# Patient Record
Sex: Female | Born: 1970 | Race: White | Hispanic: No | Marital: Married | Smoking: Never smoker
Health system: Southern US, Community
[De-identification: ages and names within clinical notes are randomized; demographics above are authoritative.]

## PROBLEM LIST (undated history)

## (undated) DIAGNOSIS — T7840XA Allergy, unspecified, initial encounter: Secondary | ICD-10-CM

## (undated) HISTORY — PX: HERNIA REPAIR: SHX51

## (undated) HISTORY — PX: GASTRIC BYPASS: SHX52

## (undated) HISTORY — DX: Allergy, unspecified, initial encounter: T78.40XA

---

## 2000-03-26 ENCOUNTER — Other Ambulatory Visit: Admission: RE | Admit: 2000-03-26 | Discharge: 2000-03-26 | Payer: Self-pay | Admitting: Gynecology

## 2000-03-26 ENCOUNTER — Encounter (INDEPENDENT_AMBULATORY_CARE_PROVIDER_SITE_OTHER): Payer: Self-pay | Admitting: Specialist

## 2007-07-18 ENCOUNTER — Ambulatory Visit (HOSPITAL_COMMUNITY): Admission: RE | Admit: 2007-07-18 | Discharge: 2007-07-18 | Payer: Self-pay | Admitting: Obstetrics & Gynecology

## 2007-07-18 IMAGING — US US MFM FETAL BPP W/ NONSTRESS ADDL GEST
1 series · 11 of 11 positions shown · non-contrast
Comparison: none

OBSTETRICAL ULTRASOUND:
 This ultrasound was performed in The [HOSPITAL], and the [facility] report will be stored to [REDACTED] PACS.

[Series 1: us mfm fetal bpp w/ nonstress addl gest · 11 acquisitions, 11 frames shown]
[im 1/11]
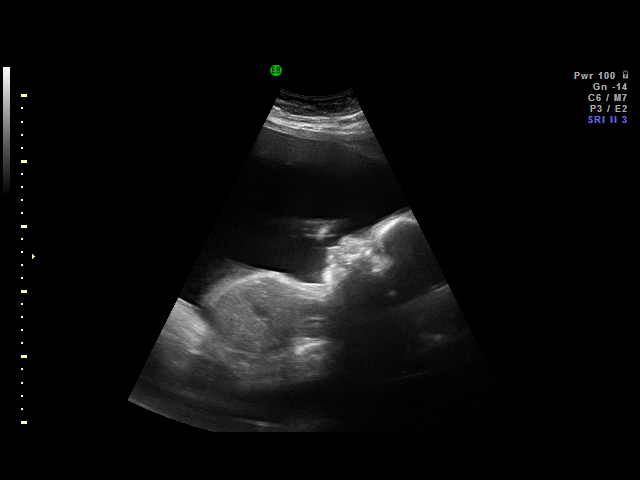
[im 2/11]
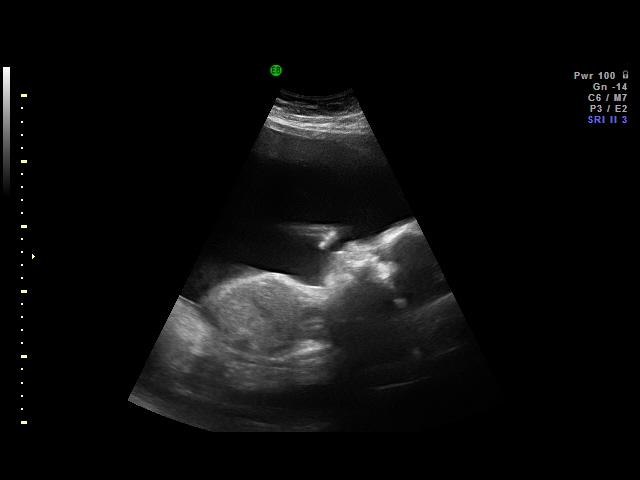
[im 3/11]
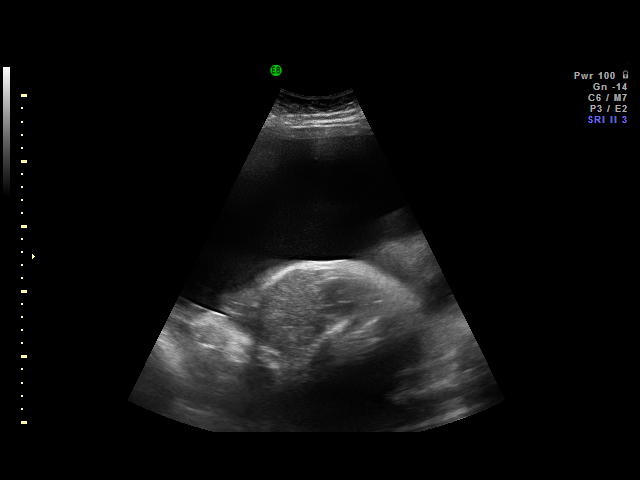
[im 4/11]
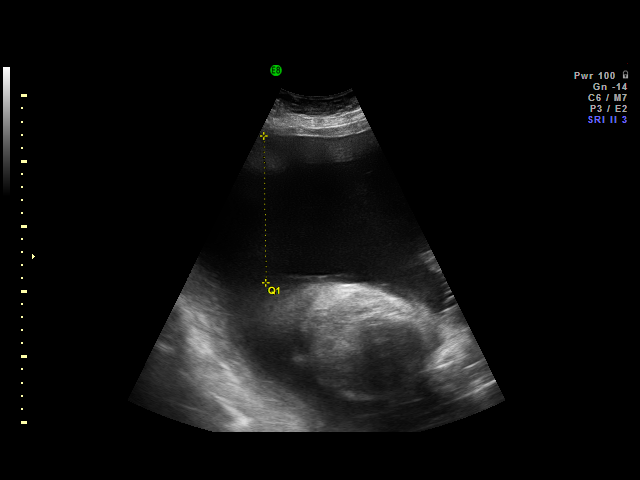
[im 5/11]
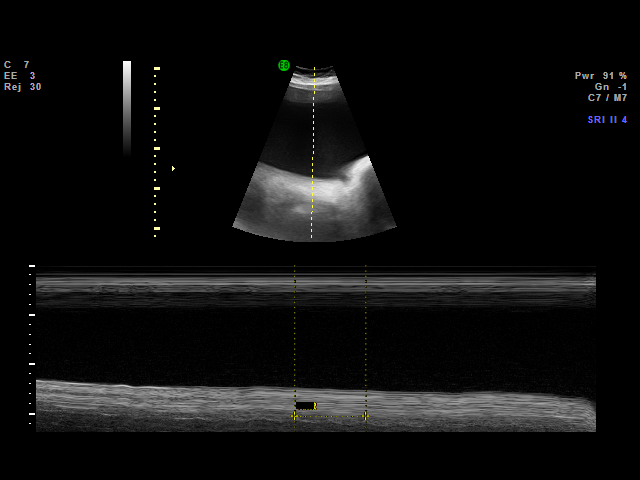
[im 6/11]
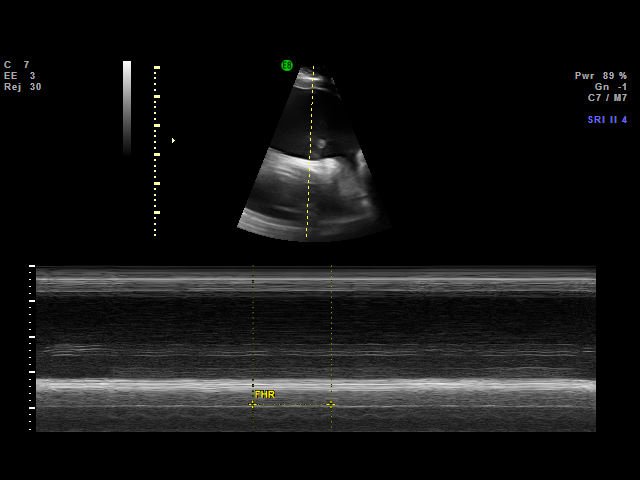
[im 7/11]
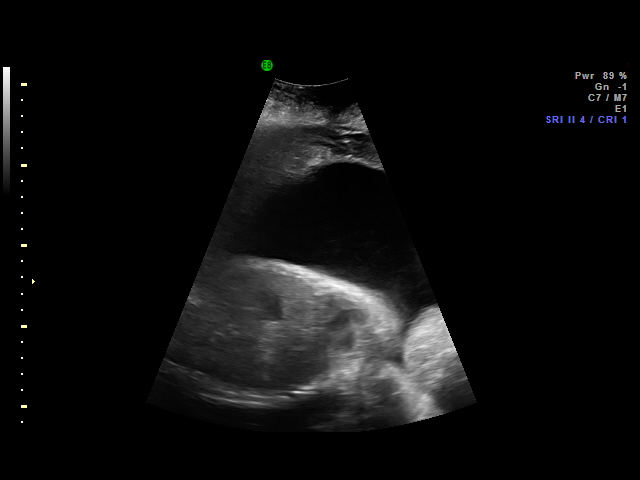
[im 8/11]
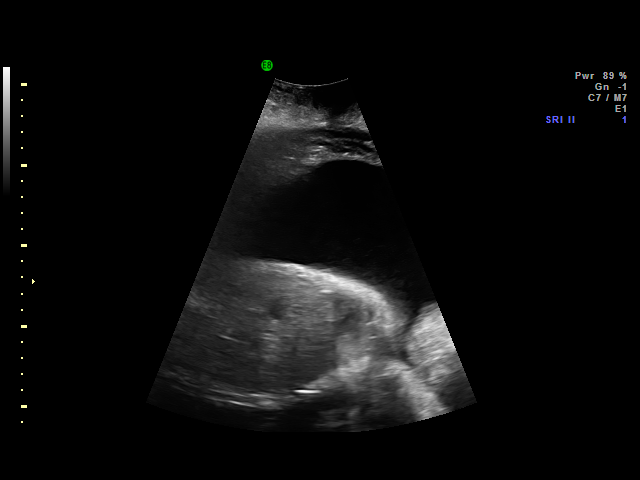
[im 9/11]
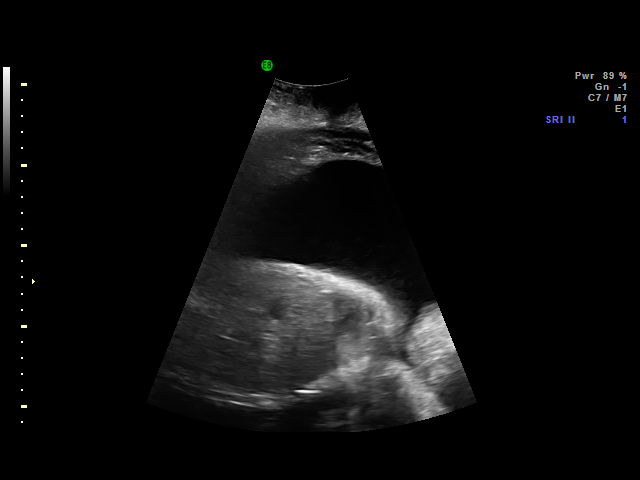
[im 10/11]
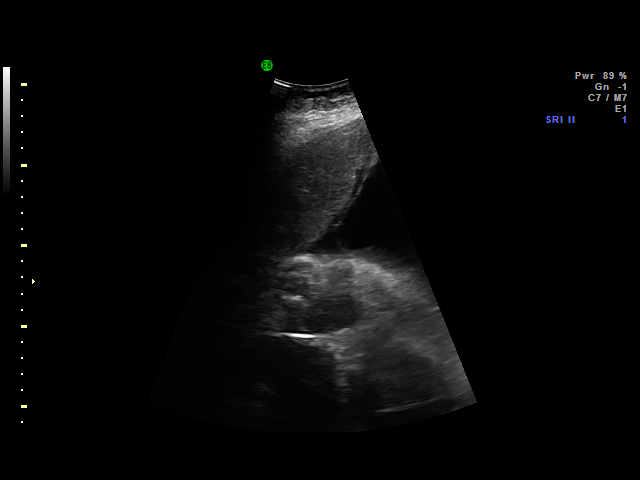
[im 11/11]
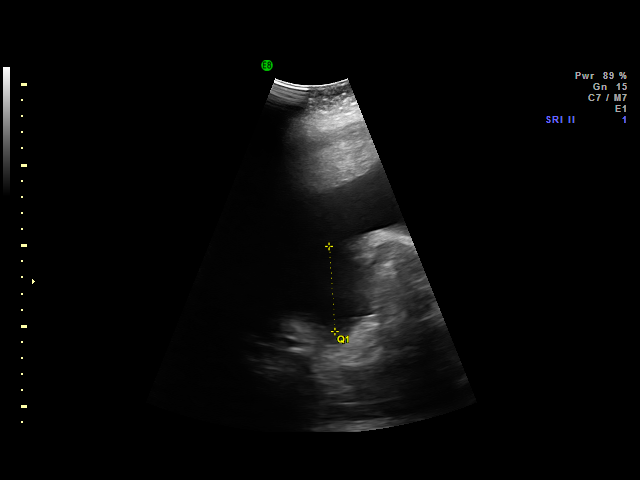

[11 of 11 positions shown; findings below may reference images not displayed]

IMPRESSION: [facility] has also been faxed to the ordering physician.

## 2015-03-14 ENCOUNTER — Ambulatory Visit (INDEPENDENT_AMBULATORY_CARE_PROVIDER_SITE_OTHER): Payer: BC Managed Care – PPO | Admitting: Emergency Medicine

## 2015-03-14 VITALS — BP 130/88 | HR 79 | Temp 98.5°F | Resp 16 | Ht 67.0 in | Wt 255.6 lb

## 2015-03-14 DIAGNOSIS — J0140 Acute pansinusitis, unspecified: Secondary | ICD-10-CM

## 2015-03-14 DIAGNOSIS — J209 Acute bronchitis, unspecified: Secondary | ICD-10-CM

## 2015-03-14 DIAGNOSIS — Z23 Encounter for immunization: Secondary | ICD-10-CM | POA: Diagnosis not present

## 2015-03-14 MED ORDER — AMOXICILLIN-POT CLAVULANATE 875-125 MG PO TABS: 1.0000 | ORAL_TABLET | Freq: Two times a day (BID) | ORAL | Status: AC

## 2015-03-14 MED ORDER — PSEUDOEPHEDRINE-GUAIFENESIN ER 60-600 MG PO TB12: 1.0000 | ORAL_TABLET | Freq: Two times a day (BID) | ORAL | Status: AC

## 2015-03-14 MED ORDER — HYDROCOD POLST-CPM POLST ER 10-8 MG/5ML PO SUER: 5.0000 mL | Freq: Two times a day (BID) | ORAL | Status: AC

## 2015-03-14 NOTE — Patient Instructions (Signed)

## 2015-03-14 NOTE — Progress Notes (Signed)
Subjective:  Patient ID: Stephanie Soto, female    DOB: 1970/11/13  Age: 45 y.o. MRN: [id number]  CC: Cough; Sinus Problem; and Flu Vaccine   HPI Stephanie Soto presents   Patient has a sensation chills. No fever. He has nasal congestion nasal discharge or postnasal drip. Characterized by being purulent  History Stephanie Soto has a past medical history of Allergy.   Stephanie Soto has past surgical history that includes Cesarean section; Gastric bypass; and Hernia repair.   Her  family history includes Cancer in her mother; Diabetes in her father; Hyperlipidemia in her father; Hypertension in her father; Stroke in her mother.  Stephanie Soto   reports that Stephanie Soto has never smoked. Stephanie Soto does not have any smokeless tobacco history on file. Her alcohol and drug histories are not on file.  No outpatient prescriptions prior to visit.   No facility-administered medications prior to visit.    Social History   Social History  . Marital Status: Married    Spouse Name: N/A  . Number of Children: N/A  . Years of Education: N/A   Social History Main Topics  . Smoking status: Never Smoker   . Smokeless tobacco: None  . Alcohol Use: None  . Drug Use: None  . Sexual Activity: Not Asked   Other Topics Concern  . None   Social History Narrative  . None     Review of Systems  Constitutional: Positive for chills. Negative for fever and appetite change.  HENT: Positive for congestion, postnasal drip, rhinorrhea and sinus pressure. Negative for ear pain and sore throat.   Eyes: Negative for pain and redness.  Respiratory: Positive for cough. Negative for shortness of breath and wheezing.   Cardiovascular: Negative for leg swelling.  Gastrointestinal: Negative for nausea, vomiting, abdominal pain, diarrhea, constipation and blood in stool.  Endocrine: Negative for polyuria.  Genitourinary: Negative for dysuria, urgency, frequency and flank pain.  Musculoskeletal: Negative for gait problem.  Skin: Negative  for rash.  Neurological: Negative for weakness and headaches.  Psychiatric/Behavioral: Negative for confusion and decreased concentration. The patient is not nervous/anxious.     Objective:  BP 130/88 mmHg  Pulse 79  Temp(Src) 98.5 F (36.9 C) (Oral)  Resp 16  Ht 5\' 7"  (1.702 m)  Wt 255 lb 9.6 oz (115.939 kg)  BMI 40.02 kg/m2  SpO2 99%  LMP   Physical Exam  Constitutional: Stephanie Soto is oriented to person, place, and time. Stephanie Soto appears well-developed and well-nourished. No distress.  HENT:  Head: Normocephalic and atraumatic.  Right Ear: External ear normal.  Left Ear: External ear normal.  Nose: Nose normal.  Eyes: Conjunctivae and EOM are normal. Pupils are equal, round, and reactive to light. No scleral icterus.  Neck: Normal range of motion. Neck supple. No tracheal deviation present.  Cardiovascular: Normal rate, regular rhythm and normal heart sounds.   Pulmonary/Chest: Effort normal. No respiratory distress. Stephanie Soto has no wheezes. Stephanie Soto has no rales.  Abdominal: Stephanie Soto exhibits no mass. There is no tenderness. There is no rebound and no guarding.  Musculoskeletal: Stephanie Soto exhibits no edema.  Lymphadenopathy:    Stephanie Soto has no cervical adenopathy.  Neurological: Stephanie Soto is alert and oriented to person, place, and time. Coordination normal.  Skin: Skin is warm and dry. No rash noted.  Psychiatric: Stephanie Soto has a normal mood and affect. Her behavior is normal.      Assessment & Plan:   There are no diagnoses linked to this encounter. I am having Stephanie Soto maintain her  omeprazole, citalopram, fexofenadine, and fluticasone.  Meds ordered this encounter  Medications  . omeprazole (PRILOSEC) 20 MG capsule    Sig: Take 20 mg by mouth daily.  . citalopram (CELEXA) 10 MG tablet    Sig: Take 10 mg by mouth daily.  . fexofenadine (ALLEGRA) 30 MG tablet    Sig: Take 30 mg by mouth 2 (two) times daily.  . fluticasone (FLONASE) 50 MCG/ACT nasal spray    Sig: Place into both nostrils daily.     Appropriate red flag conditions were discussed with the patient as well as actions that should be taken.  Patient expressed his understanding.  Follow-up: No Follow-up on file.  Carmelina DaneAnderson, Monti Villers S, MD
